# Patient Record
Sex: Male | Born: 1965 | Hispanic: No | Marital: Married | Smoking: Never smoker
Health system: Southern US, Community
[De-identification: ages and names within clinical notes are randomized; demographics above are authoritative.]

---

## 2001-02-25 ENCOUNTER — Ambulatory Visit (HOSPITAL_BASED_OUTPATIENT_CLINIC_OR_DEPARTMENT_OTHER): Admission: RE | Admit: 2001-02-25 | Discharge: 2001-02-25 | Payer: Self-pay | Admitting: Orthopedic Surgery

## 2016-05-06 ENCOUNTER — Emergency Department (HOSPITAL_COMMUNITY): Payer: 59

## 2016-05-06 ENCOUNTER — Encounter (HOSPITAL_COMMUNITY): Payer: Self-pay | Admitting: Emergency Medicine

## 2016-05-06 ENCOUNTER — Emergency Department (HOSPITAL_COMMUNITY)
Admission: EM | Admit: 2016-05-06 | Discharge: 2016-05-06 | Disposition: A | Discharge status: Other Acute Inpatient Hospital | Payer: 59 | Attending: Emergency Medicine | Admitting: Emergency Medicine

## 2016-05-06 DIAGNOSIS — Y939 Activity, unspecified: Secondary | ICD-10-CM | POA: Insufficient documentation

## 2016-05-06 DIAGNOSIS — T22212A Burn of second degree of left forearm, initial encounter: Secondary | ICD-10-CM | POA: Insufficient documentation

## 2016-05-06 DIAGNOSIS — X158XXA Contact with other hot household appliances, initial encounter: Secondary | ICD-10-CM | POA: Diagnosis not present

## 2016-05-06 DIAGNOSIS — Y92009 Unspecified place in unspecified non-institutional (private) residence as the place of occurrence of the external cause: Secondary | ICD-10-CM | POA: Insufficient documentation

## 2016-05-06 DIAGNOSIS — T22211A Burn of second degree of right forearm, initial encounter: Secondary | ICD-10-CM | POA: Insufficient documentation

## 2016-05-06 DIAGNOSIS — T22111A Burn of first degree of right forearm, initial encounter: Secondary | ICD-10-CM | POA: Diagnosis not present

## 2016-05-06 DIAGNOSIS — T300 Burn of unspecified body region, unspecified degree: Secondary | ICD-10-CM

## 2016-05-06 DIAGNOSIS — Z79899 Other long term (current) drug therapy: Secondary | ICD-10-CM | POA: Insufficient documentation

## 2016-05-06 DIAGNOSIS — Y999 Unspecified external cause status: Secondary | ICD-10-CM | POA: Diagnosis not present

## 2016-05-06 DIAGNOSIS — IMO0002 Reserved for concepts with insufficient information to code with codable children: Secondary | ICD-10-CM

## 2016-05-06 LAB — CBC WITH DIFFERENTIAL/PLATELET
BASOS ABS: 0.1 10*3/uL (ref 0.0–0.1)
Basophils Relative: 1 %
EOS ABS: 0.4 10*3/uL (ref 0.0–0.7)
EOS PCT: 4 %
HCT: 46.7 % (ref 39.0–52.0)
Hemoglobin: 17.1 g/dL — ABNORMAL HIGH (ref 13.0–17.0)
LYMPHS PCT: 32 %
Lymphs Abs: 2.8 10*3/uL (ref 0.7–4.0)
MCH: 32.3 pg (ref 26.0–34.0)
MCHC: 36.6 g/dL — ABNORMAL HIGH (ref 30.0–36.0)
MCV: 88.3 fL (ref 78.0–100.0)
MONO ABS: 0.9 10*3/uL (ref 0.1–1.0)
Monocytes Relative: 11 %
Neutro Abs: 4.4 10*3/uL (ref 1.7–7.7)
Neutrophils Relative %: 51 %
PLATELETS: 298 10*3/uL (ref 150–400)
RBC: 5.29 MIL/uL (ref 4.22–5.81)
RDW: 12 % (ref 11.5–15.5)
WBC: 8.6 10*3/uL (ref 4.0–10.5)

## 2016-05-06 LAB — BASIC METABOLIC PANEL
ANION GAP: 11 (ref 5–15)
BUN: 16 mg/dL (ref 6–20)
CALCIUM: 9.6 mg/dL (ref 8.9–10.3)
CO2: 20 mmol/L — ABNORMAL LOW (ref 22–32)
Chloride: 108 mmol/L (ref 101–111)
Creatinine, Ser: 1.08 mg/dL (ref 0.61–1.24)
GFR calc Af Amer: >60 mL/min (ref >60)
GLUCOSE: 126 mg/dL — AB (ref 65–99)
Potassium: 3.4 mmol/L — ABNORMAL LOW (ref 3.5–5.1)
SODIUM: 139 mmol/L (ref 135–145)

## 2016-05-06 MED ORDER — ONDANSETRON HCL 4 MG/2ML IJ SOLN
4.0000 mg | Freq: Once | INTRAMUSCULAR | Status: AC
  Administered 2016-05-06: 4 mg via INTRAVENOUS
  Filled 2016-05-06: qty 2

## 2016-05-06 MED ORDER — TETANUS-DIPHTH-ACELL PERTUSSIS 5-2.5-18.5 LF-MCG/0.5 IM SUSP
0.5000 mL | Freq: Once | INTRAMUSCULAR | Status: AC
  Administered 2016-05-06: 0.5 mL via INTRAMUSCULAR
  Filled 2016-05-06: qty 0.5

## 2016-05-06 MED ORDER — HYDROMORPHONE HCL 1 MG/ML IJ SOLN
1.0000 mg | Freq: Once | INTRAMUSCULAR | Status: AC
  Administered 2016-05-06: 1 mg via INTRAVENOUS
  Filled 2016-05-06: qty 1

## 2016-05-06 MED ORDER — SODIUM CHLORIDE 0.9 % IV BOLUS (SEPSIS)
1000.0000 mL | Freq: Once | INTRAVENOUS | Status: AC
  Administered 2016-05-06: 1000 mL via INTRAVENOUS
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
